# Patient Record
Sex: Male | Born: 1985 | Race: Black or African American | Hispanic: No | Marital: Married | Smoking: Never smoker
Health system: Southern US, Community
[De-identification: ages and names within clinical notes are randomized; demographics above are authoritative.]

---

## 2007-09-03 ENCOUNTER — Emergency Department: Payer: Self-pay | Admitting: Emergency Medicine

## 2007-09-07 ENCOUNTER — Ambulatory Visit: Payer: Self-pay | Admitting: Specialist

## 2007-09-07 ENCOUNTER — Other Ambulatory Visit: Payer: Self-pay

## 2007-09-12 ENCOUNTER — Ambulatory Visit: Payer: Self-pay | Admitting: Specialist

## 2008-12-17 IMAGING — CR DG FOREARM 2V*L*
1 series · 2 of 2 positions shown · non-contrast
Comparison: none

REASON FOR EXAM: foreign body
COMMENTS:

[Series 1: view not recorded · 0.17mm/px · 2 of 2 slices shown]
[im 1/2]
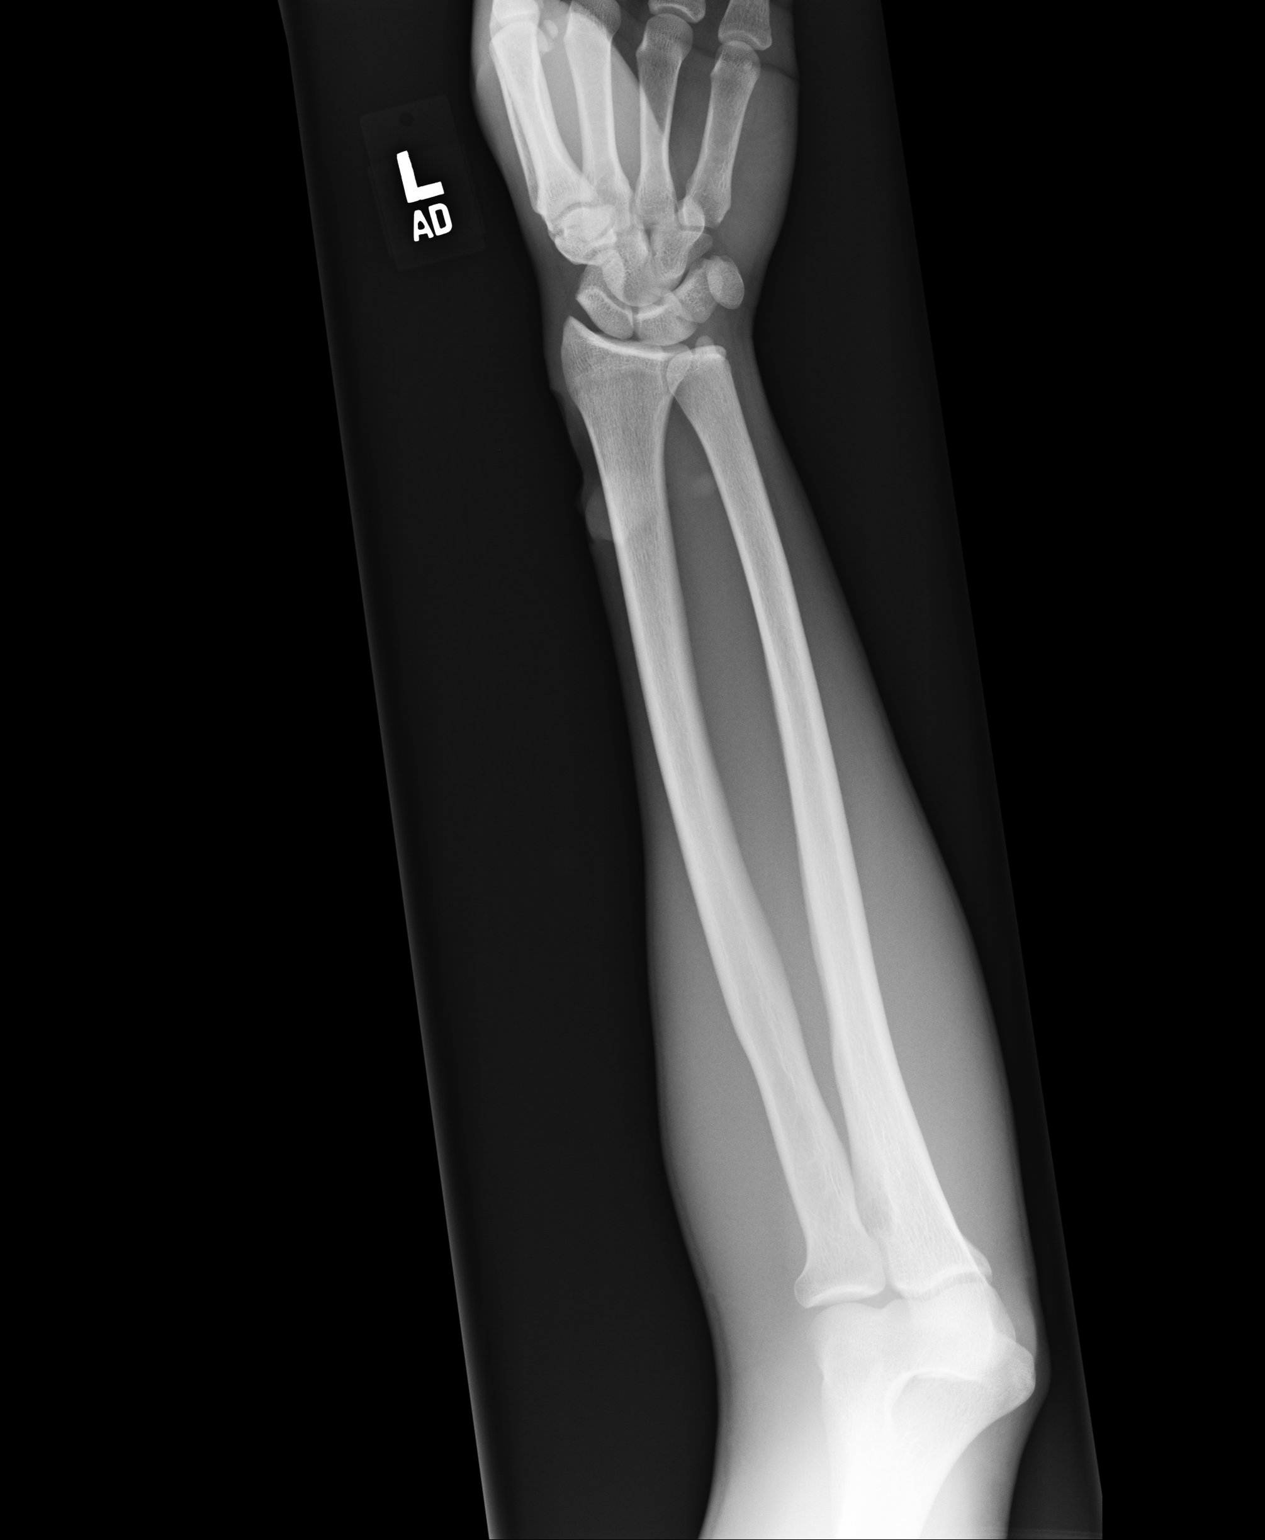
[im 2/2]
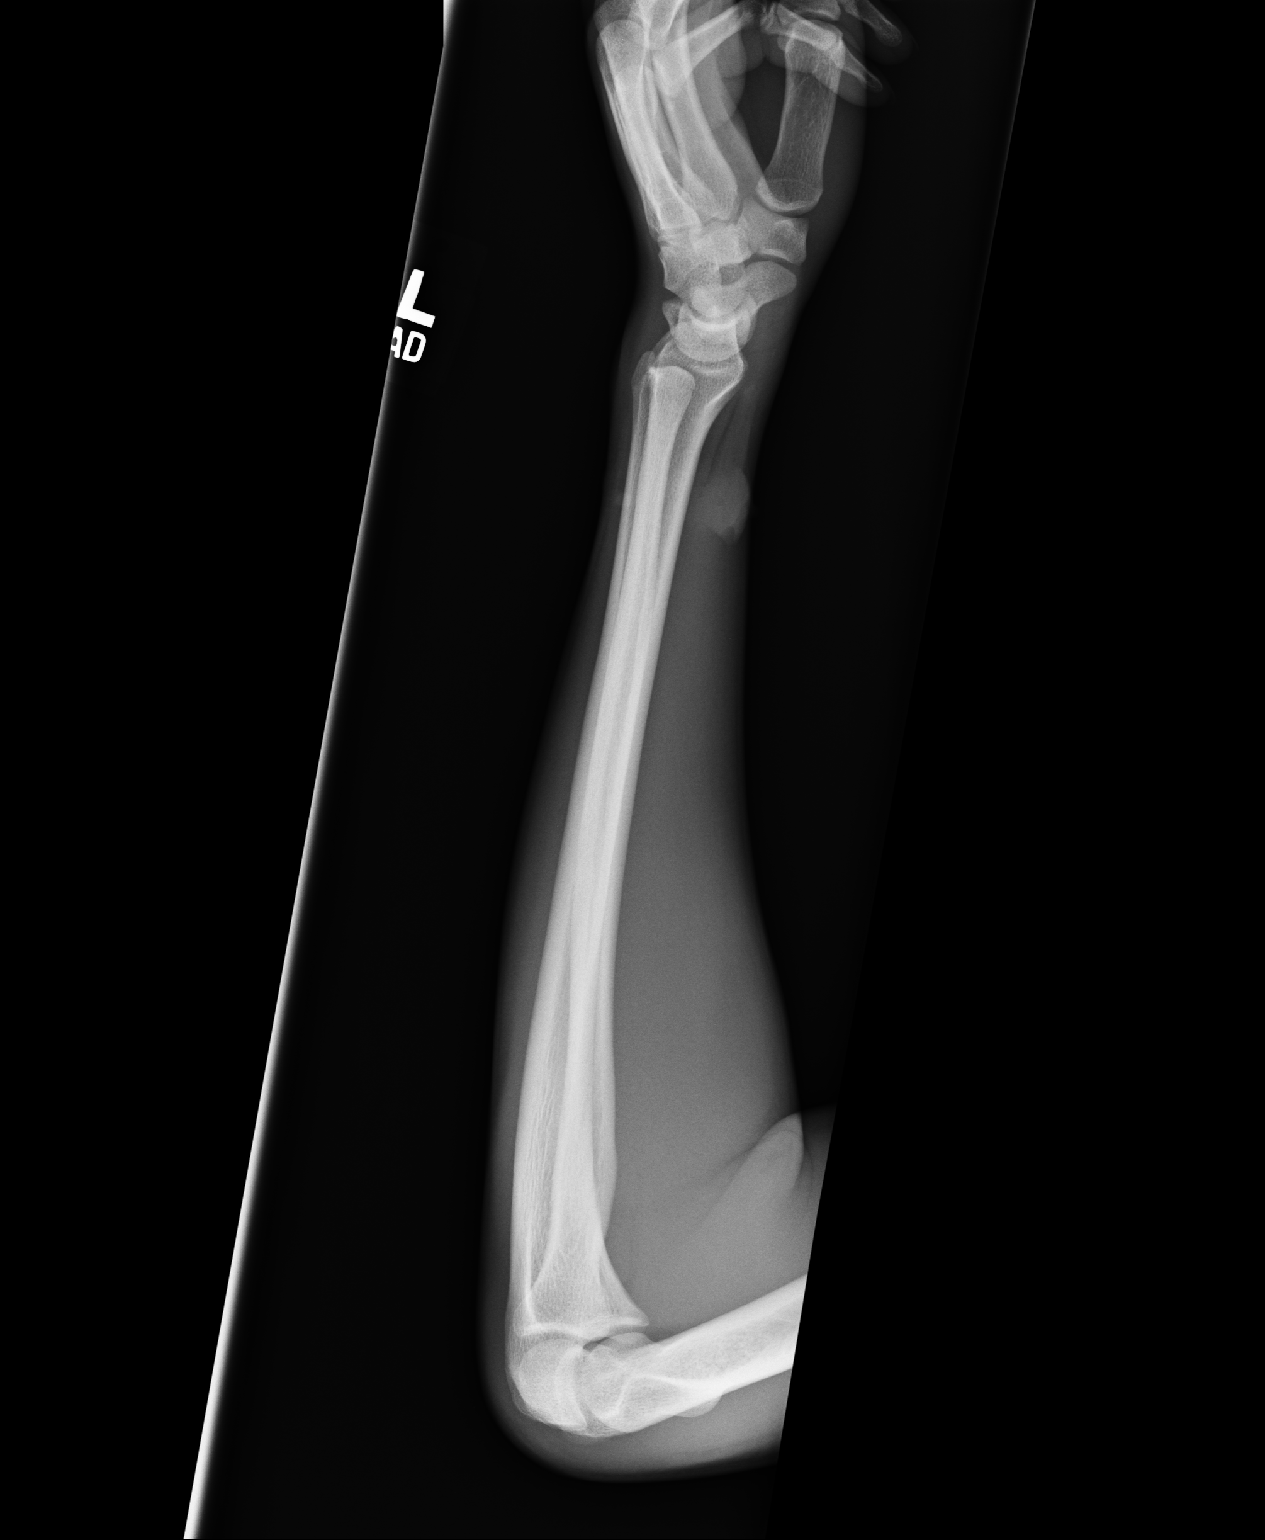

[2 of 2 positions shown; findings below may reference images not displayed]

PROCEDURE:     DXR - DXR FOREARM LEFT  - September 03, 2007  [DATE]

RESULT:     The radius and ulna are intact. There is radiodense material
projecting over the ventral aspect of the metaphyseal portion of the
forearm. This may reflect hematoma. No metallic foreign body is identified.
There is similar density over the dorsum of the arm.
IMPRESSION: I do not see evidence of a metallic foreign body. Correlation with the
underlying soft tissue injury will be needed. No fracture is identified.

## 2013-09-08 ENCOUNTER — Emergency Department: Payer: Self-pay | Admitting: Emergency Medicine

## 2014-04-14 ENCOUNTER — Emergency Department: Payer: Self-pay | Admitting: Emergency Medicine

## 2015-07-29 IMAGING — CR DG CLAVICLE*R*
1 series · 2 of 2 positions shown · non-contrast
Comparison: None.

CLINICAL DATA: Right clavicle pain after getting question off of
the porch tonight.

EXAM:
RIGHT CLAVICLE - 2+ VIEWS

[Series 1: dxr clavicle right · 0.14mm/px · 2 of 2 slices shown]
[im 1/2]
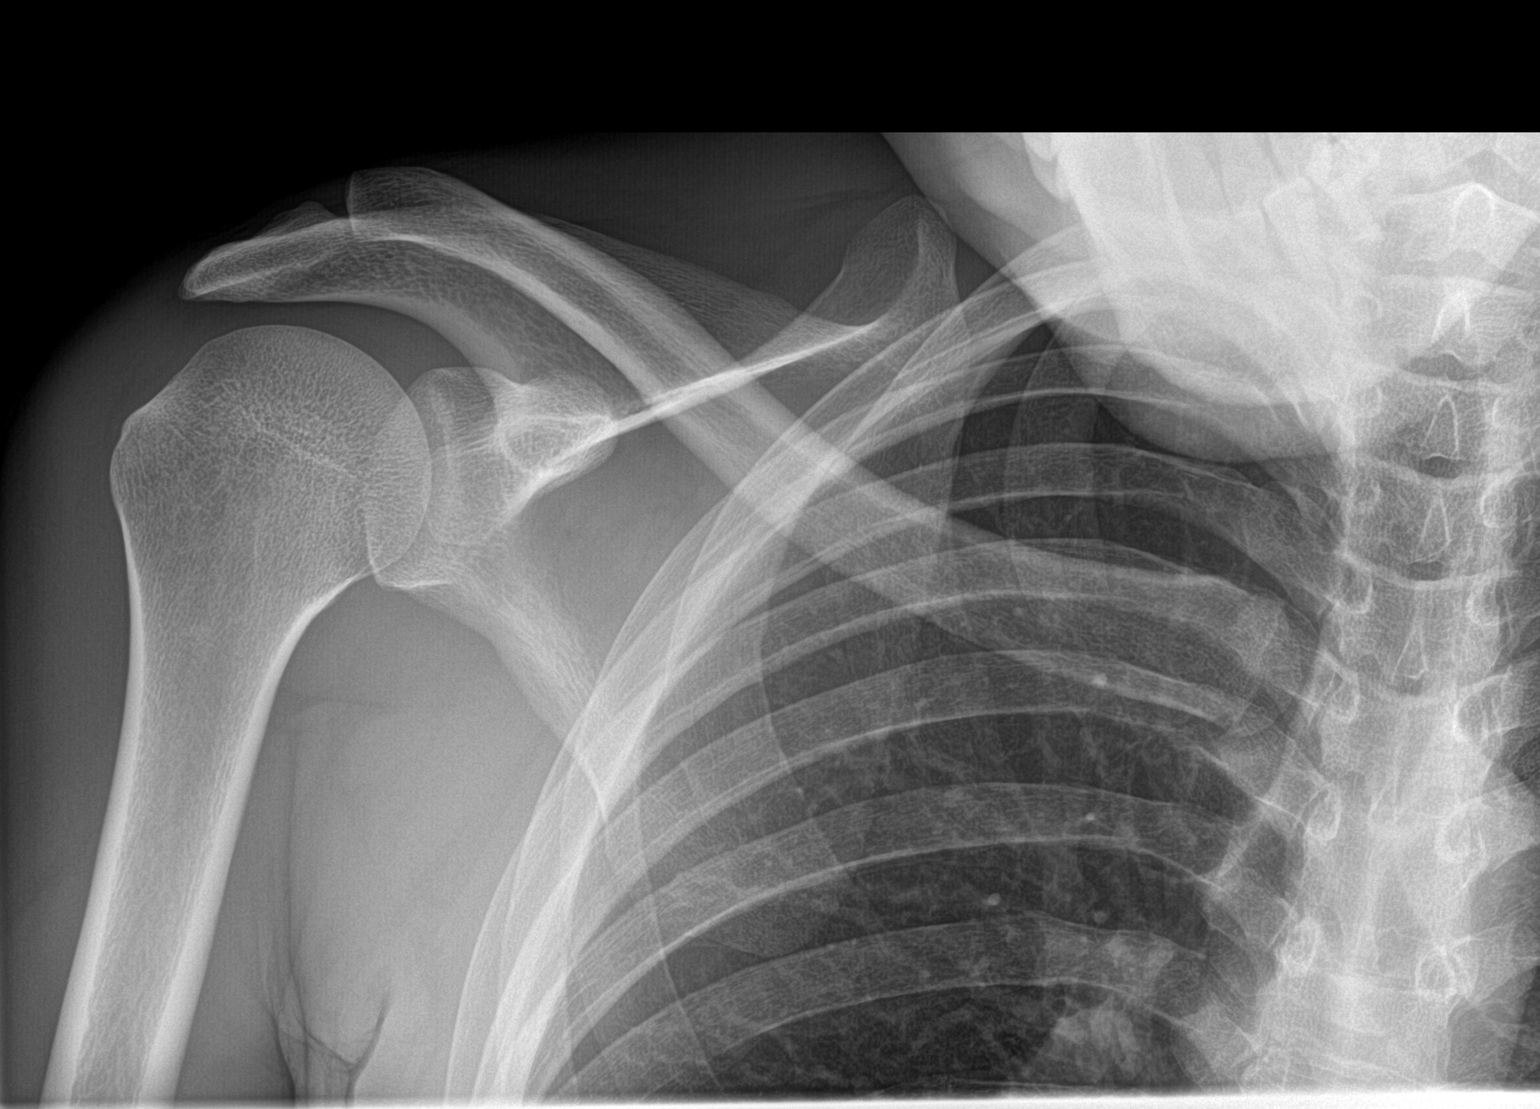
[im 2/2]
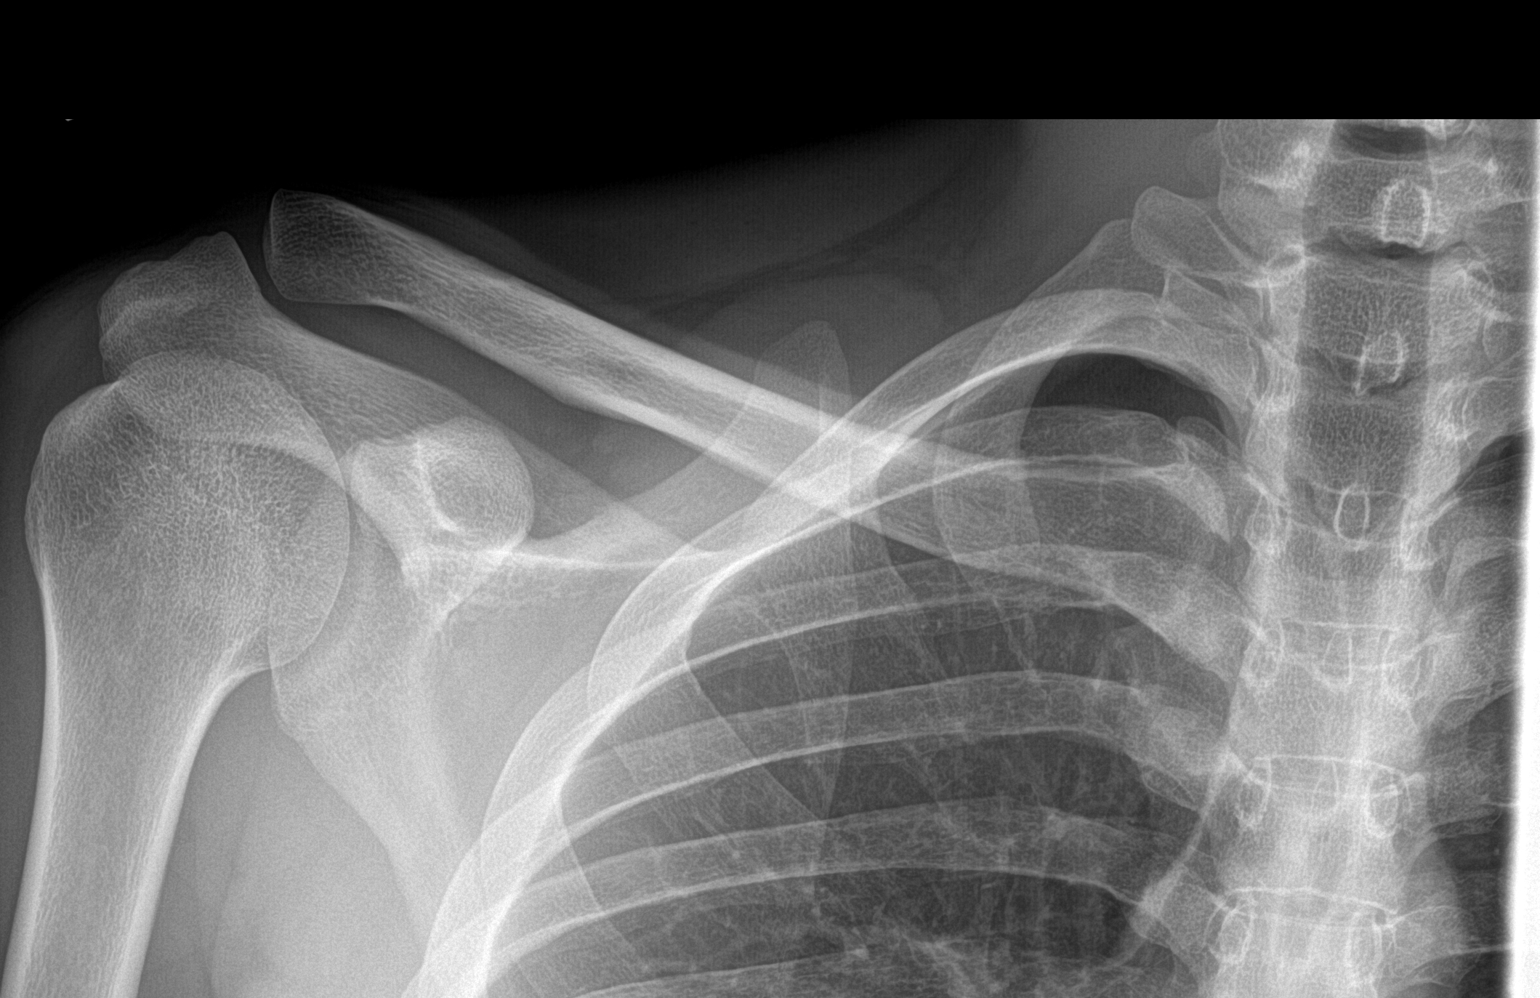

[2 of 2 positions shown; findings below may reference images not displayed]

FINDINGS: There is no evidence of fracture or other focal bone lesions. Soft
tissues are unremarkable.
IMPRESSION: Negative.

## 2020-05-22 ENCOUNTER — Emergency Department
Admission: EM | Admit: 2020-05-22 | Discharge: 2020-05-24 | Disposition: A | Discharge status: Home / Self Care | Payer: Self-pay | Attending: Emergency Medicine | Admitting: Emergency Medicine

## 2020-05-22 ENCOUNTER — Other Ambulatory Visit: Payer: Self-pay

## 2020-05-22 DIAGNOSIS — Z20822 Contact with and (suspected) exposure to covid-19: Secondary | ICD-10-CM | POA: Insufficient documentation

## 2020-05-22 DIAGNOSIS — R451 Restlessness and agitation: Secondary | ICD-10-CM | POA: Insufficient documentation

## 2020-05-22 DIAGNOSIS — F1290 Cannabis use, unspecified, uncomplicated: Secondary | ICD-10-CM | POA: Insufficient documentation

## 2020-05-22 DIAGNOSIS — F1424 Cocaine dependence with cocaine-induced mood disorder: Secondary | ICD-10-CM | POA: Insufficient documentation

## 2020-05-22 DIAGNOSIS — F302 Manic episode, severe with psychotic symptoms: Secondary | ICD-10-CM

## 2020-05-22 DIAGNOSIS — F1210 Cannabis abuse, uncomplicated: Secondary | ICD-10-CM

## 2020-05-22 DIAGNOSIS — F1494 Cocaine use, unspecified with cocaine-induced mood disorder: Secondary | ICD-10-CM

## 2020-05-22 DIAGNOSIS — F1410 Cocaine abuse, uncomplicated: Secondary | ICD-10-CM

## 2020-05-22 DIAGNOSIS — R45851 Suicidal ideations: Secondary | ICD-10-CM | POA: Insufficient documentation

## 2020-05-22 LAB — CBC
HCT: 44.1 % (ref 39.0–52.0)
Hemoglobin: 14.8 g/dL (ref 13.0–17.0)
MCH: 31.5 pg (ref 26.0–34.0)
MCHC: 33.6 g/dL (ref 30.0–36.0)
MCV: 93.8 fL (ref 80.0–100.0)
Platelets: 307 10*3/uL (ref 150–400)
RBC: 4.7 MIL/uL (ref 4.22–5.81)
RDW: 12.7 % (ref 11.5–15.5)
WBC: 7.5 10*3/uL (ref 4.0–10.5)
nRBC: 0 % (ref 0.0–0.2)

## 2020-05-22 LAB — COMPREHENSIVE METABOLIC PANEL
ALT: 17 U/L (ref 0–44)
AST: 27 U/L (ref 15–41)
Albumin: 4.6 g/dL (ref 3.5–5.0)
Alkaline Phosphatase: 61 U/L (ref 38–126)
Anion gap: 13 (ref 5–15)
BUN: 10 mg/dL (ref 6–20)
CO2: 27 mmol/L (ref 22–32)
Calcium: 9.3 mg/dL (ref 8.9–10.3)
Chloride: 96 mmol/L — ABNORMAL LOW (ref 98–111)
Creatinine, Ser: 0.96 mg/dL (ref 0.61–1.24)
GFR, Estimated: >60 mL/min (ref >60)
Glucose, Bld: 89 mg/dL (ref 70–99)
Potassium: 4 mmol/L (ref 3.5–5.1)
Sodium: 136 mmol/L (ref 135–145)
Total Bilirubin: 0.7 mg/dL (ref 0.3–1.2)
Total Protein: 7.6 g/dL (ref 6.5–8.1)

## 2020-05-22 LAB — URINALYSIS, COMPLETE (UACMP) WITH MICROSCOPIC
Bacteria, UA: NONE SEEN
Bilirubin Urine: NEGATIVE
Glucose, UA: NEGATIVE mg/dL
Hgb urine dipstick: NEGATIVE
Ketones, ur: NEGATIVE mg/dL
Leukocytes,Ua: NEGATIVE
Nitrite: NEGATIVE
Protein, ur: NEGATIVE mg/dL
Specific Gravity, Urine: 1.008 (ref 1.005–1.030)
pH: 7 (ref 5.0–8.0)

## 2020-05-22 LAB — ETHANOL: Alcohol, Ethyl (B): <10 mg/dL (ref <10)

## 2020-05-22 NOTE — ED Notes (Signed)
Pt removed jeans Belt  Blue tshirt Socks  black sneakers Silver necklace Silver cross with chain.  Wallet  Phone

## 2020-05-22 NOTE — ED Provider Notes (Signed)
[facility] Emergency Department Provider Note  ____________________________________________   First MD Initiated Contact with Patient 05/22/20 2151     (approximate)  I have reviewed the triage vital signs and the nursing notes.   HISTORY  Chief Complaint Psychiatric Evaluation    HPI FOXX KLARICH is a 34 y.o. male here with reported erratic behavior and reported suicidal ideation.  The patient arrives under IVC.  Per the IVC paperwork, he has been increasingly erratic with bizarre behavior.  He has been smoking marijuana.  He has been "talking out of his head" and making hyperreligious statements, like he is a prophet.  He reportedly told his baby's mother that he would hurt himself if they could not be together.  On my assessment, the patient is somewhat erratic, but states he does not know why is here.  He denies any of the reported statements on the IVC paperwork.  Remainder of history limited due to patient's psychiatric condition.        No past medical history on file.  There are no problems to display for this patient.    Prior to Admission medications   Not on File    Allergies Patient has no known allergies.  No family history on file.  Social History Social History   Tobacco Use  . Smoking status: Not on file  Substance Use Topics  . Alcohol use: Not on file  . Drug use: Not on file    Review of Systems  Review of Systems  Unable to perform ROS: Mental status change     ____________________________________________  PHYSICAL EXAM:      VITAL SIGNS: ED Triage Vitals [05/22/20 2121]  Enc Vitals Group     BP 131/74     Pulse Rate 74     Resp 20     Temp 98.9 F (37.2 C)     Temp Source Oral     SpO2 99 %     Weight 150 lb (68 kg)     Height 5\' 9"  (1.753 m)     Head Circumference      Peak Flow      Pain Score 0     Pain Loc      Pain Edu?      Excl. in GC?      Physical Exam Vitals and nursing note  reviewed.  Constitutional:      General: He is not in acute distress.    Appearance: He is well-developed.  HENT:     Head: Normocephalic and atraumatic.  Eyes:     Conjunctiva/sclera: Conjunctivae normal.  Cardiovascular:     Rate and Rhythm: Normal rate and regular rhythm.     Heart sounds: Normal heart sounds.  Pulmonary:     Effort: Pulmonary effort is normal. No respiratory distress.     Breath sounds: No wheezing.  Abdominal:     General: There is no distension.  Musculoskeletal:     Cervical back: Neck supple.  Skin:    General: Skin is warm.     Capillary Refill: Capillary refill takes less than 2 seconds.     Findings: No rash.  Neurological:     Mental Status: He is alert and oriented to person, place, and time.     Motor: No abnormal muscle tone.  Psychiatric:     Comments: Impulsive, somewhat agitated, labile.       ____________________________________________   LABS (all labs ordered are listed, but only abnormal  results are displayed)  Labs Reviewed  COMPREHENSIVE METABOLIC PANEL - Abnormal; Notable for the following components:      Result Value   Chloride 96 (*)    All other components within normal limits  RESPIRATORY PANEL BY RT PCR (FLU A&B, COVID)  CBC  ETHANOL  URINALYSIS, COMPLETE (UACMP) WITH MICROSCOPIC  URINE DRUG SCREEN, QUALITATIVE ([facility] ONLY)    ____________________________________________  EKG:  ________________________________________  RADIOLOGY All imaging, including plain films, CT scans, and ultrasounds, independently reviewed by me, and interpretations confirmed via formal radiology reads.  ED MD interpretation:     Official radiology report(s): No results found.  ____________________________________________  PROCEDURES   Procedure(s) performed (including Critical Care):  Procedures  ____________________________________________  INITIAL IMPRESSION / MDM / ASSESSMENT AND PLAN / ED COURSE  As part of my medical  decision making, I reviewed the following data within the electronic MEDICAL RECORD NUMBER Nursing notes reviewed and incorporated, Old chart reviewed, Notes from prior ED visits, and [place] Controlled Substance Database       *SHLOIME KEILMAN was evaluated in Emergency Department on 05/22/2020 for the symptoms described in the history of present illness. He was evaluated in the context of the global COVID-19 pandemic, which necessitated consideration that the patient might be at risk for infection with the SARS-CoV-2 virus that causes COVID-19. Institutional protocols and algorithms that pertain to the evaluation of patients at risk for COVID-19 are in a state of rapid change based on information released by regulatory bodies including the CDC and federal and state organizations. These policies and algorithms were followed during the patient's care in the ED.  Some ED evaluations and interventions may be delayed as a result of limited staffing during the pandemic.*     Medical Decision Making:  34 yo M here with erratic behavior, THC use, and reported SI per IVC paperwork. Pt denies the above on my interview, but IVC paperwork certainly sounds concerning. He does appear somewhat labile, irritated. Will have TTS/Psych eval. Labs reassuring - no significant lyte abnormality, tox labs unremarkable. UDS is pending.   The patient has been placed in psychiatric observation due to the need to provide a safe environment for the patient while obtaining psychiatric consultation and evaluation, as well as ongoing medical and medication management to treat the patient's condition.  The patient has been placed under full IVC at this time.   ____________________________________________  FINAL CLINICAL IMPRESSION(S) / ED DIAGNOSES  Final diagnoses:  Suicidal ideation     MEDICATIONS GIVEN DURING THIS VISIT:  Medications - No data to display   ED Discharge Orders    None       Note:  This document was  prepared using Dragon voice recognition software and may include unintentional dictation errors.   Shaune Pollack, MD 05/22/20 2318

## 2020-05-22 NOTE — ED Notes (Signed)
Pt states "I'm not sure why I'm here, they just picked me up and now I'm here." Pt smiling and cooperative at this time. States "My baby momma I guess called them but I've been trying to find her for 2 days she went off the map. I had use someone else's phone to call her". When asked pt states they have children and live together. Pt repeats "I don't know why I'm here." This RN explained IVC process, pt states understanding and remains calm at this time. Drink and meal tray given. Denies pain or further needs at this time.

## 2020-05-22 NOTE — ED Triage Notes (Signed)
Pt brought in by [place] [employer] under IVC, pt states "I have no clue why I am here". Pt denies any SI or HI at this time.

## 2020-05-23 ENCOUNTER — Inpatient Hospital Stay: Admission: RE | Admit: 2020-05-23 | Payer: Self-pay | Source: Intra-hospital | Admitting: Psychiatry

## 2020-05-23 DIAGNOSIS — F302 Manic episode, severe with psychotic symptoms: Secondary | ICD-10-CM

## 2020-05-23 DIAGNOSIS — F1210 Cannabis abuse, uncomplicated: Secondary | ICD-10-CM

## 2020-05-23 DIAGNOSIS — F1410 Cocaine abuse, uncomplicated: Secondary | ICD-10-CM

## 2020-05-23 LAB — URINE DRUG SCREEN, QUALITATIVE (ARMC ONLY)
Amphetamines, Ur Screen: NOT DETECTED
Barbiturates, Ur Screen: NOT DETECTED
Benzodiazepine, Ur Scrn: NOT DETECTED
Cannabinoid 50 Ng, Ur ~~LOC~~: POSITIVE — AB
Cocaine Metabolite,Ur ~~LOC~~: POSITIVE — AB
MDMA (Ecstasy)Ur Screen: NOT DETECTED
Methadone Scn, Ur: NOT DETECTED
Opiate, Ur Screen: NOT DETECTED
Phencyclidine (PCP) Ur S: NOT DETECTED
Tricyclic, Ur Screen: NOT DETECTED

## 2020-05-23 LAB — RESPIRATORY PANEL BY RT PCR (FLU A&B, COVID)
Influenza A by PCR: NEGATIVE
Influenza B by PCR: NEGATIVE
SARS Coronavirus 2 by RT PCR: NEGATIVE

## 2020-05-23 MED ORDER — ZIPRASIDONE MESYLATE 20 MG IM SOLR
20.0000 mg | Freq: Once | INTRAMUSCULAR | Status: AC
  Administered 2020-05-23: 20 mg via INTRAMUSCULAR
  Filled 2020-05-23: qty 20

## 2020-05-23 MED ORDER — LORAZEPAM 2 MG PO TABS: 2.0000 mg | ORAL_TABLET | ORAL | Status: DC | PRN

## 2020-05-23 MED ORDER — OLANZAPINE 10 MG PO TBDP
10.0000 mg | ORAL_TABLET | Freq: Every day | ORAL | Status: DC
  Administered 2020-05-23: 10 mg via ORAL
  Filled 2020-05-23 (×3): qty 1

## 2020-05-23 MED ORDER — LORAZEPAM 2 MG/ML IJ SOLN
2.0000 mg | INTRAMUSCULAR | Status: DC | PRN
  Administered 2020-05-23: 2 mg via INTRAMUSCULAR
  Filled 2020-05-23: qty 1

## 2020-05-23 NOTE — BH Assessment (Signed)
Patient can come down after 8pm   Call to give report: [phone]  Patient is to be admitted to [facility] by Dr. Neale Burly.  Attending Physician will be. Dr. Neale Burly.   Patient has been assigned to room 325, by [facility] Charge Nurse Demetria, RN.   Intake Paper Work has been signed and placed on patient chart.  ER staff is aware of the admission: 1. Carlisle Beers, ER Secretary  2. Katrinka Blazing, ER MD  3. Amy, Patient's Nurse  4. Ethelene Browns, Patient Access.

## 2020-05-23 NOTE — ED Notes (Signed)
PT REMAINS IVC W/ PSYCH CONSULT COMPLETED. PENDING PLACEMENT

## 2020-05-23 NOTE — ED Notes (Signed)
Patient transferred to [facility] from ED to room 3 after screening for contraband. Report received from Mac, RN including Situation, Background, Assessment and Recommendations. Pt oriented to unit including Q15 minute rounds as well as the security cameras for their protection. Patient is alert and oriented, warm and dry in no acute distress. Patient denies SI, HI, and AVH. Pt. Encouraged to let this nurse know if needs arise. Pt is unhappy with arrival to [facility] due to not wanting to be "locked up."  Pt states he will "play your game now. But I am only in here 24 hours. Gaspar Cola tried to do this to me too." Pt to restroom at this time.

## 2020-05-23 NOTE — ED Notes (Addendum)
Plan of care discussed including his pending inpatient hospitalization - spoke with him about his IVC and what it says  He states  "Fuck this - I don't want to go into the hospital - just send my ass to jail so that I can get out and go to work tomorrow - You are going to make me lose my job - I got three kids - they need me  - I am not even married to her - how can she convince y'all to hold me here"  I explained to him that she does not make the decision here - I told him I do not make the decisions here - explained that the MD's here have decided for his safety and for his manic paranoid behaviors  - he will need hospitalization  He is angry and pacing within the dayroom

## 2020-05-23 NOTE — BH Assessment (Signed)
Leighton Parody from [facility] called requesting additionally documentation. Task completed at 3:40 PM.
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
## 2020-05-23 NOTE — ED Notes (Signed)
Hourly rounding completed at this time, patient currently asleep in room. No complaints, stable, and in no acute distress. Q15 minute rounds and monitoring via Security Cameras to continue. 
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
## 2020-05-23 NOTE — ED Provider Notes (Signed)
Emergency Medicine Observation Re-evaluation Note  Lucas Kramer is a 34 y.o. male, seen on rounds today.  Pt initially presented to the ED for complaints of Psychiatric Evaluation Currently, the patient is resting.  Physical Exam  BP 131/74 (BP Location: Left Arm)   Pulse 74   Temp 98.9 F (37.2 C) (Oral)   Resp 20   Ht 5\' 9"  (1.753 m)   Wt 68 kg   SpO2 99%   BMI 22.15 kg/m  Physical Exam Constitutional:      Appearance: He is not ill-appearing or toxic-appearing.  Eyes:     Extraocular Movements: Extraocular movements intact.     Pupils: Pupils are equal, round, and reactive to light.  Cardiovascular:     Rate and Rhythm: Normal rate.  Pulmonary:     Effort: Pulmonary effort is normal.  Abdominal:     General: There is no distension.  Skin:    General: Skin is warm and dry.  Neurological:     General: No focal deficit present.     Cranial Nerves: No cranial nerve deficit.      ED Course / MDM  EKG:    I have reviewed the labs performed to date as well as medications administered while in observation.  Recent changes in the last 24 hours include continued bed search.  Plan  Current plan is for  Inpatient psychiatric care. Patient is under full IVC at this time.   , MD 05/23/20 [phone]
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
## 2020-05-23 NOTE — ED Notes (Signed)
Report attempted, per Collene Mares, RN charge, staff called out and they must reassess prior to taking pt, phone number given, charge will be notified

## 2020-05-23 NOTE — ED Notes (Signed)
IVC patient to BMU 325 after 8p
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
## 2020-05-23 NOTE — Consult Note (Signed)
  Please psychiatry consult note: Patient seen and chart reviewed.  Case reviewed with emergency room physician.  Patient appears to be best served by inpatient hospitalization.  Will uphold involuntary commitment status.  Orders and full note to follow

## 2020-05-24 DIAGNOSIS — F1494 Cocaine use, unspecified with cocaine-induced mood disorder: Secondary | ICD-10-CM | POA: Diagnosis present

## 2020-05-24 NOTE — ED Notes (Signed)
Pt to toilet and returned to bed 

## 2020-05-24 NOTE — ED Provider Notes (Signed)
Emergency Medicine Observation Re-evaluation Note  Lucas Kramer is a 34 y.o. male, seen on rounds today.  Pt initially presented to the ED for complaints of Psychiatric Evaluation Currently, the patient is still in bed, voices no complaints  Physical Exam  BP (!) 91/49 (BP Location: Left Arm)   Pulse 90   Temp 98.8 F (37.1 C) (Oral)   Resp 20   Ht 1.753 m (5\' 9" )   Wt 68 kg   SpO2 100%   BMI 22.15 kg/m  Physical Exam General: In bed no acute distress  Lungs: No increased work of breathing Psych: No significant changes from yesterday, still with some delusions and psychosis ED Course / MDM  Patient has been seen by psychiatrist Dr. who recommends inpatient admission  Plan  Current plan is for inpatient admission. Patient is under full IVC at this time.   Toni Amend, MD 05/24/20 [phone]

## 2020-05-24 NOTE — ED Provider Notes (Signed)
Patient has been seen by behavioral team today, at this time they are recommending discharge   Jene Every, MD 05/24/20 1237
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
## 2020-05-24 NOTE — Discharge Instructions (Signed)
[facility]  Non-profit organization in [place], [place] Address: [address] [place], [place], [place] [address] Hours:  Closed ? Opens 9AM Mon Phone: [phone]
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
## 2020-05-24 NOTE — ED Notes (Signed)
Pt to RN station, given ice water, pt cleaned room of food and drink trash

## 2021-03-14 ENCOUNTER — Other Ambulatory Visit: Payer: Self-pay

## 2021-03-14 ENCOUNTER — Encounter: Payer: Self-pay | Admitting: Emergency Medicine

## 2021-03-14 ENCOUNTER — Emergency Department
Admission: EM | Admit: 2021-03-14 | Discharge: 2021-03-14 | Disposition: A | Discharge status: Home / Self Care | Payer: Self-pay | Attending: Emergency Medicine | Admitting: Emergency Medicine

## 2021-03-14 DIAGNOSIS — K029 Dental caries, unspecified: Secondary | ICD-10-CM | POA: Insufficient documentation

## 2021-03-14 MED ORDER — ONDANSETRON 4 MG PO TBDP: 4.0000 mg | ORAL_TABLET | Freq: Four times a day (QID) | ORAL | 0 refills | Status: DC | PRN

## 2021-03-14 MED ORDER — IBUPROFEN 800 MG PO TABS: 800.0000 mg | ORAL_TABLET | Freq: Three times a day (TID) | ORAL | 0 refills | Status: AC | PRN

## 2021-03-14 MED ORDER — HYDROCODONE-ACETAMINOPHEN 5-325 MG PO TABS
2.0000 | ORAL_TABLET | Freq: Once | ORAL | Status: AC
Start: 2021-03-14 — End: 2021-03-14
  Administered 2021-03-14: 2 via ORAL
  Filled 2021-03-14: qty 2

## 2021-03-14 MED ORDER — ONDANSETRON 4 MG PO TBDP
4.0000 mg | ORAL_TABLET | Freq: Once | ORAL | Status: AC
  Administered 2021-03-14: 4 mg via ORAL
  Filled 2021-03-14: qty 1

## 2021-03-14 MED ORDER — PENICILLIN V POTASSIUM 250 MG PO TABS
500.0000 mg | ORAL_TABLET | Freq: Once | ORAL | Status: AC
  Administered 2021-03-14: 500 mg via ORAL
  Filled 2021-03-14: qty 2

## 2021-03-14 MED ORDER — IBUPROFEN 800 MG PO TABS
800.0000 mg | ORAL_TABLET | Freq: Once | ORAL | Status: AC
  Administered 2021-03-14: 800 mg via ORAL
  Filled 2021-03-14: qty 1

## 2021-03-14 MED ORDER — PENICILLIN V POTASSIUM 500 MG PO TABS: 500.0000 mg | ORAL_TABLET | Freq: Four times a day (QID) | ORAL | 0 refills | Status: AC

## 2021-03-14 MED ORDER — HYDROCODONE-ACETAMINOPHEN 5-325 MG PO TABS: 2.0000 | ORAL_TABLET | Freq: Four times a day (QID) | ORAL | 0 refills | Status: AC | PRN

## 2021-03-14 NOTE — ED Notes (Signed)
Pt A&O4 ambulatory at d/c with independent steady gait, NAD. Pt verbalized understanding of d/c instructions, prescriptions and follow up care.

## 2021-03-14 NOTE — ED Triage Notes (Signed)
Pt c/o L side dental pain with swelling since yesterday

## 2021-03-14 NOTE — Discharge Instructions (Signed)

## 2021-03-14 NOTE — ED Provider Notes (Signed)
[facility] Emergency Department Provider Note  ____________________________________________   Event Date/Time   First MD Initiated Contact with Patient 03/14/21 [phone]     (approximate)  I have reviewed the triage vital signs and the nursing notes.   HISTORY  Chief Complaint Dental Pain    HPI Lucas Kramer is a 35 y.o. male with history of cocaine abuse who presents to the emergency department with complaints of left upper dental pain for the past day.  States he has a Education officer, community and he has scheduled an appointment but is not until next week.  No relief with over-the-counter pain medication.  No fevers, facial swelling, difficulty swallowing, speaking or breathing.      left upper  History reviewed. No pertinent past medical history.  Patient Active Problem List   Diagnosis Date Noted   Cocaine-induced mood disorder (HCC) 05/24/2020   Cocaine abuse (HCC) 05/23/2020   Cannabis abuse 05/23/2020    History reviewed. No pertinent surgical history.  Prior to Admission medications   Medication Sig Start Date End Date Taking? Authorizing Provider  HYDROcodone-acetaminophen (NORCO/VICODIN) 5-325 MG tablet Take 2 tablets by mouth every 6 (six) hours as needed. 03/14/21  Yes Marv Alfrey, Layla Maw, DO  ibuprofen (ADVIL) 800 MG tablet Take 1 tablet (800 mg total) by mouth every 8 (eight) hours as needed for mild pain. 03/14/21  Yes Dhruva Orndoff, Baxter Hire N, DO  ondansetron (ZOFRAN ODT) 4 MG disintegrating tablet Take 1 tablet (4 mg total) by mouth every 6 (six) hours as needed for nausea or vomiting. 03/14/21  Yes Esperansa Sarabia, Baxter Hire N, DO  penicillin v potassium (VEETID) 500 MG tablet Take 1 tablet (500 mg total) by mouth 4 (four) times daily. 03/14/21  Yes Polo Mcmartin, Layla Maw, DO    Allergies Patient has no known allergies.  History reviewed. No pertinent family history.  Social History    Review of Systems Constitutional: No fever. Eyes: No visual changes. ENT: No sore  throat. Cardiovascular: Denies chest pain. Respiratory: Denies shortness of breath. Gastrointestinal: No nausea, vomiting, diarrhea. Genitourinary: Negative for dysuria. Musculoskeletal: Negative for back pain. Skin: Negative for rash. Neurological: Negative for focal weakness or numbness.  ____________________________________________   PHYSICAL EXAM:  VITAL SIGNS: ED Triage Vitals [03/14/21 0601]  Enc Vitals Group     BP 131/78     Pulse Rate (!) 112     Resp 14     Temp 98.2 F (36.8 C)     Temp Source Oral     SpO2 98 %     Weight 149 lb 14.6 oz (68 kg)     Height 5\' 9"  (1.753 m)     Head Circumference      Peak Flow      Pain Score 10     Pain Loc      Pain Edu?      Excl. in GC?    CONSTITUTIONAL: Alert and oriented and responds appropriately to questions.  Afebrile, well-nourished, appears uncomfortable but nontoxic HEAD: Normocephalic EYES: Conjunctivae clear, pupils appear equal, EOM appear intact ENT: normal nose; moist mucous membranes, patient has decay noted to the left upper third molar without sign of drainable abscess.  There is mild gingival swelling.  Tongue sits flat in the bottom of the mouth.  No sign of Ludwig's angina.  Normal phonation.  No trismus, drooling.  Tolerating secretions.  No tonsillar hypertrophy or exudate.  No uvular deviation or swelling. NECK: Supple, normal ROM CARD: Regular and tachycardic; S1 and  S2 appreciated; no murmurs, no clicks, no rubs, no gallops RESP: Normal chest excursion without splinting or tachypnea; breath sounds clear and equal bilaterally; no wheezes, no rhonchi, no rales, no hypoxia or respiratory distress, speaking full sentences ABD/GI: Normal bowel sounds; non-distended; soft, non-tender, no rebound, no guarding, no peritoneal signs, no hepatosplenomegaly BACK: The back appears normal EXT: Normal ROM in all joints; no deformity noted, no edema; no cyanosis SKIN: Normal color for age and race; warm; no rash on  exposed skin NEURO: Moves all extremities equally PSYCH: The patient's mood and manner are appropriate.  ____________________________________________   LABS (all labs ordered are listed, but only abnormal results are displayed)  Labs Reviewed - No data to display ____________________________________________  EKG   ____________________________________________  RADIOLOGY I, Johnwesley Lederman, personally viewed and evaluated these images (plain radiographs) as part of my medical decision making, as well as reviewing the written report by the radiologist.  ED MD interpretation:    Official radiology report(s): No results found.  ____________________________________________   PROCEDURES  Procedure(s) performed (including Critical Care):  Procedures    ____________________________________________   INITIAL IMPRESSION / ASSESSMENT AND PLAN / ED COURSE  As part of my medical decision making, I reviewed the following data within the electronic MEDICAL RECORD NUMBER Nursing notes reviewed and incorporated, Old chart reviewed, Notes from prior ED visits, and [place] Controlled Substance Database         Patient here with dental caries causing dental pain.  Will discharge on prophylactic antibiotics and provide with pain medication.  He states he has dental follow-up next week.  No sign of any emergent condition present today including Ludwig's angina.  Will discharge home.  Discussed return precautions.  Wife drove him here to the emergency department.  Given pain medication in the ED.  At this time, I do not feel there is any life-threatening condition present. I have reviewed, interpreted and discussed all results (EKG, imaging, lab, urine as appropriate) and exam findings with patient/family. I have reviewed nursing notes and appropriate previous records.  I feel the patient is safe to be discharged home without further emergent workup and can continue workup as an outpatient as needed.  Discussed usual and customary return precautions. Patient/family verbalize understanding and are comfortable with this plan.  Outpatient follow-up has been provided as needed. All questions have been answered.  ____________________________________________   FINAL CLINICAL IMPRESSION(S) / ED DIAGNOSES  Final diagnoses:  Pain due to dental caries     ED Discharge Orders          Ordered    penicillin v potassium (VEETID) 500 MG tablet  4 times daily        03/14/21 0633    ibuprofen (ADVIL) 800 MG tablet  Every 8 hours PRN        03/14/21 0633    HYDROcodone-acetaminophen (NORCO/VICODIN) 5-325 MG tablet  Every 6 hours PRN        03/14/21 0633    ondansetron (ZOFRAN ODT) 4 MG disintegrating tablet  Every 6 hours PRN        03/14/21 5638            *Please note:  Lucas Kramer was evaluated in Emergency Department on 03/14/2021 for the symptoms described in the history of present illness. He was evaluated in the context of the global COVID-19 pandemic, which necessitated consideration that the patient might be at risk for infection with the SARS-CoV-2 virus that causes COVID-19. Institutional protocols and algorithms that  pertain to the evaluation of patients at risk for COVID-19 are in a state of rapid change based on information released by regulatory bodies including the CDC and federal and state organizations. These policies and algorithms were followed during the patient's care in the ED.  Some ED evaluations and interventions may be delayed as a result of limited staffing during and the pandemic.*   Note:  This document was prepared using Dragon voice recognition software and may include unintentional dictation errors.    Chardae Mulkern, Layla Maw, DO 03/14/21 [phone]

## 2023-10-11 ENCOUNTER — Emergency Department
Admission: EM | Admit: 2023-10-11 | Discharge: 2023-10-12 | Disposition: A | Discharge status: Home / Self Care | Payer: MEDICAID | Attending: Emergency Medicine | Admitting: Emergency Medicine

## 2023-10-11 ENCOUNTER — Emergency Department: Payer: MEDICAID

## 2023-10-11 ENCOUNTER — Other Ambulatory Visit: Payer: Self-pay

## 2023-10-11 DIAGNOSIS — R55 Syncope and collapse: Secondary | ICD-10-CM | POA: Diagnosis not present

## 2023-10-11 DIAGNOSIS — R0789 Other chest pain: Secondary | ICD-10-CM | POA: Diagnosis not present

## 2023-10-11 DIAGNOSIS — R509 Fever, unspecified: Secondary | ICD-10-CM | POA: Diagnosis present

## 2023-10-11 DIAGNOSIS — J101 Influenza due to other identified influenza virus with other respiratory manifestations: Secondary | ICD-10-CM | POA: Insufficient documentation

## 2023-10-11 LAB — COMPREHENSIVE METABOLIC PANEL
ALT: 17 U/L (ref 0–44)
AST: 25 U/L (ref 15–41)
Albumin: 4.2 g/dL (ref 3.5–5.0)
Alkaline Phosphatase: 56 U/L (ref 38–126)
Anion gap: 9 (ref 5–15)
BUN: 10 mg/dL (ref 6–20)
CO2: 24 mmol/L (ref 22–32)
Calcium: 9 mg/dL (ref 8.9–10.3)
Chloride: 102 mmol/L (ref 98–111)
Creatinine, Ser: 1.03 mg/dL (ref 0.61–1.24)
GFR, Estimated: >60 mL/min (ref >60)
Glucose, Bld: 99 mg/dL (ref 70–99)
Potassium: 4 mmol/L (ref 3.5–5.1)
Sodium: 135 mmol/L (ref 135–145)
Total Bilirubin: 0.8 mg/dL (ref 0.0–1.2)
Total Protein: 7 g/dL (ref 6.5–8.1)

## 2023-10-11 LAB — RESP PANEL BY RT-PCR (RSV, FLU A&B, COVID)  RVPGX2
Influenza A by PCR: POSITIVE — AB
Influenza B by PCR: NEGATIVE
Resp Syncytial Virus by PCR: NEGATIVE
SARS Coronavirus 2 by RT PCR: NEGATIVE

## 2023-10-11 LAB — CBC
HCT: 40.9 % (ref 39.0–52.0)
Hemoglobin: 13.5 g/dL (ref 13.0–17.0)
MCH: 31.3 pg (ref 26.0–34.0)
MCHC: 33 g/dL (ref 30.0–36.0)
MCV: 94.7 fL (ref 80.0–100.0)
Platelets: 221 10*3/uL (ref 150–400)
RBC: 4.32 MIL/uL (ref 4.22–5.81)
RDW: 12.7 % (ref 11.5–15.5)
WBC: 6.2 10*3/uL (ref 4.0–10.5)
nRBC: 0 % (ref 0.0–0.2)

## 2023-10-11 LAB — GROUP A STREP BY PCR: Group A Strep by PCR: NOT DETECTED

## 2023-10-11 LAB — LIPASE, BLOOD: Lipase: 22 U/L (ref 11–51)

## 2023-10-11 MED ORDER — ACETAMINOPHEN 325 MG PO TABS
650.0000 mg | ORAL_TABLET | Freq: Once | ORAL | Status: AC
Start: 2023-10-11 — End: 2023-10-11
  Administered 2023-10-11: 650 mg via ORAL
  Filled 2023-10-11: qty 2

## 2023-10-11 MED ORDER — KETOROLAC TROMETHAMINE 30 MG/ML IJ SOLN
30.0000 mg | Freq: Once | INTRAMUSCULAR | Status: AC
  Administered 2023-10-12: 30 mg via INTRAVENOUS
  Filled 2023-10-11: qty 1

## 2023-10-11 MED ORDER — SODIUM CHLORIDE 0.9 % IV BOLUS (SEPSIS)
1000.0000 mL | Freq: Once | INTRAVENOUS | Status: AC
  Administered 2023-10-12: 1000 mL via INTRAVENOUS

## 2023-10-11 MED ORDER — LIDOCAINE VISCOUS HCL 2 % MT SOLN
15.0000 mL | Freq: Once | OROMUCOSAL | Status: AC
  Administered 2023-10-12: 15 mL via OROMUCOSAL
  Filled 2023-10-11: qty 15

## 2023-10-11 MED ORDER — ONDANSETRON HCL 4 MG/2ML IJ SOLN
4.0000 mg | Freq: Once | INTRAMUSCULAR | Status: AC
  Administered 2023-10-12: 4 mg via INTRAVENOUS
  Filled 2023-10-11: qty 2

## 2023-10-11 NOTE — ED Triage Notes (Addendum)
 Pt reports since last night he has had cough congestion fever, pt states he think he has covid. Pt also c/o sore throat and generalized abd pain

## 2023-10-11 NOTE — ED Provider Notes (Signed)
 [facility] Provider Note    Event Date/Time   First MD Initiated Contact with Patient 10/11/23 2323     (approximate)   History   Cough and Fever   HPI  Lucas Kramer is a 38 y.o. male with history of substance use disorder who presents to the emergency department with flulike symptoms for the past 2 days.  He has had fevers, cough, congestion, vomiting, body aches.  Reports he had 2 syncopal events today while walking.  Reports he did have some chest discomfort and shortness of breath.  No diarrhea.  No chest pain or shortness of breath currently.  No history of PE, DVT, exogenous estrogen use, recent fractures, surgery, trauma, hospitalization, prolonged travel or other immobilization. No lower extremity swelling or pain. No calf tenderness.  Patient is complaining of pain with swallowing.  History provided by patient.    History reviewed. No pertinent past medical history.  History reviewed. No pertinent surgical history.  MEDICATIONS:  Prior to Admission medications   Medication Sig Start Date End Date Taking? Authorizing Provider  HYDROcodone-acetaminophen (NORCO/VICODIN) 5-325 MG tablet Take 2 tablets by mouth every 6 (six) hours as needed. 03/14/21   Jolyne Laye, Layla Maw, DO  ibuprofen (ADVIL) 800 MG tablet Take 1 tablet (800 mg total) by mouth every 8 (eight) hours as needed for mild pain. 03/14/21   Dhalia Zingaro, Layla Maw, DO  ondansetron (ZOFRAN ODT) 4 MG disintegrating tablet Take 1 tablet (4 mg total) by mouth every 6 (six) hours as needed for nausea or vomiting. 03/14/21   Klayten Jolliff, Layla Maw, DO  penicillin v potassium (VEETID) 500 MG tablet Take 1 tablet (500 mg total) by mouth 4 (four) times daily. 03/14/21   Willow Reczek, Layla Maw, DO    Physical Exam   Triage Vital Signs: ED Triage Vitals  Encounter Vitals Group     BP 10/11/23 1906 100/84     Systolic BP Percentile --      Diastolic BP Percentile --      Pulse Rate 10/11/23 1906 (!) 103     Resp  10/11/23 1906 20     Temp 10/11/23 1906 (!) 100.5 F (38.1 C)     Temp Source 10/11/23 1906 Oral     SpO2 10/11/23 1906 99 %     Weight 10/11/23 1905 160 lb (72.6 kg)     Height 10/11/23 1905 5\' 9"  (1.753 m)     Head Circumference --      Peak Flow --      Pain Score 10/11/23 1905 10     Pain Loc --      Pain Education --      Exclude from Growth Chart --     Most recent vital signs: Vitals:   10/12/23 0100 10/12/23 0200  BP: (!) 103/55 (!) 103/57  Pulse: 97 89  Resp:    Temp:    SpO2: 95% 96%    CONSTITUTIONAL: Alert, responds appropriately to questions. Well-appearing; well-nourished HEAD: Normocephalic, atraumatic EYES: Conjunctivae clear, pupils appear equal, sclera nonicteric ENT: normal nose; moist mucous membranes, yellow drainage noted in the posterior oropharynx, no tonsillar hypertrophy or exudate, no uvular deviation, no trismus, no stridor or drooling, normal phonation NECK: Supple, normal ROM CARD: RRR; S1 and S2 appreciated RESP: Normal chest excursion without splinting or tachypnea; breath sounds clear and equal bilaterally; no wheezes, no rhonchi, no rales, no hypoxia or respiratory distress, speaking full sentences ABD/GI: Non-distended; soft, non-tender, no rebound, no guarding, no  peritoneal signs BACK: The back appears normal EXT: Normal ROM in all joints; no deformity noted, no edema, no calf tenderness or calf swelling SKIN: Normal color for age and race; warm; no rash on exposed skin NEURO: Moves all extremities equally, normal speech PSYCH: The patient's mood and manner are appropriate.   ED Results / Procedures / Treatments   LABS: (all labs ordered are listed, but only abnormal results are displayed) Labs Reviewed  RESP PANEL BY RT-PCR (RSV, FLU A&B, COVID)  RVPGX2 - Abnormal; Notable for the following components:      Result Value   Influenza A by PCR POSITIVE (*)    All other components within normal limits  GROUP A STREP BY PCR  LIPASE,  BLOOD  COMPREHENSIVE METABOLIC PANEL  CBC  TROPONIN I (HIGH SENSITIVITY)     EKG:  EKG Interpretation Date/Time:  Wednesday October 12 2023 02:06:53 EDT Ventricular Rate:  92 PR Interval:  138 QRS Duration:  90 QT Interval:  342 QTC Calculation: 422 R Axis:   56  Text Interpretation: Normal sinus rhythm Normal ECG When compared with ECG of 07-Sep-2007 15:53, No significant change was found Confirmed by Rochele Raring [phone]) on 10/12/2023 2:11:55 AM         RADIOLOGY: My personal review and interpretation of imaging: Chest x-ray clear.  I have personally reviewed all radiology reports.   DG Chest 2 View Result Date: 10/11/2023 CLINICAL DATA:  cough EXAM: CHEST - 2 VIEW COMPARISON:  None Available. FINDINGS: The heart and mediastinal contours are within normal limits. No focal consolidation. No pulmonary edema. No pleural effusion. No pneumothorax. No acute osseous abnormality. IMPRESSION: No active cardiopulmonary disease. Electronically Signed   By: Tish Frederickson M.D.   On: 10/11/2023 21:43     PROCEDURES:  Critical Care performed: No     Procedures    IMPRESSION / MDM / ASSESSMENT AND PLAN / ED COURSE  I reviewed the triage vital signs and the nursing notes.    Patient here with flulike symptoms, syncope.     DIFFERENTIAL DIAGNOSIS (includes but not limited to):   Flu, other viral URI, pneumonia, dehydration, anemia, electrolyte derangement, arrhythmia, ACS, PE seem less likely.   Patient's presentation is most consistent with acute presentation with potential threat to life or bodily function.   PLAN: Workup initiated from triage.  Normal hemoglobin, electrolytes, renal function, LFTs and lipase.  Will add on troponin.  He is flu positive.  Strep negative.  Chest x-ray reviewed and interpreted by myself and the radiologist and shows no infiltrate, edema, widened mediastinum, cardiomegaly.  Will obtain EKG.  Will give IV fluids, Toradol, Zofran for  symptomatic relief.   MEDICATIONS GIVEN IN ED: Medications  acetaminophen (TYLENOL) tablet 650 mg (650 mg Oral Given 10/11/23 1911)  ketorolac (TORADOL) 30 MG/ML injection 30 mg (30 mg Intravenous Given 10/12/23 0003)  sodium chloride 0.9 % bolus 1,000 mL (0 mLs Intravenous Stopped 10/12/23 0056)  ondansetron (ZOFRAN) injection 4 mg (4 mg Intravenous Given 10/12/23 0002)  lidocaine (XYLOCAINE) 2 % viscous mouth solution 15 mL (15 mLs Mouth/Throat Given 10/12/23 0003)     ED COURSE: Patient reports feeling better.  Tolerating p.o.  Troponin negative.  EKG shows no arrhythmia, interval abnormality, ischemia, delta wave.  I feel he is safe for discharge.  Discussed risk and benefits of antiviral such as Tamiflu, Xofluza.  Patient declined these medications at this time.  Discussed supportive care instructions and return precautions.  Will discharge with viscous lidocaine  to help with his sore throat and Zofran for nausea and vomiting.   At this time, I do not feel there is any life-threatening condition present. I reviewed all nursing notes, vitals, pertinent previous records.  All lab and urine results, EKGs, imaging ordered have been independently reviewed and interpreted by myself.  I reviewed all available radiology reports from any imaging ordered this visit.  Based on my assessment, I feel the patient is safe to be discharged home without further emergent workup and can continue workup as an outpatient as needed. Discussed all findings, treatment plan as well as usual and customary return precautions.  They verbalize understanding and are comfortable with this plan.  Outpatient follow-up has been provided as needed.  All questions have been answered.    CONSULTS:  none   OUTSIDE RECORDS REVIEWED: Reviewed prior admission in March 2024 at [facility].       FINAL CLINICAL IMPRESSION(S) / ED DIAGNOSES   Final diagnoses:  Influenza A  Syncope, unspecified syncope type     Rx / DC Orders    ED Discharge Orders          Ordered    Ambulatory Referral to Primary Care (Establish Care)        10/12/23 0210    ondansetron (ZOFRAN-ODT) 4 MG disintegrating tablet  Every 6 hours PRN        10/12/23 0211    lidocaine (XYLOCAINE) 2 % solution  Every 4 hours PRN        10/12/23 0211             Note:  This document was prepared using Dragon voice recognition software and may include unintentional dictation errors.   Joyann Spidle, Layla Maw, DO 10/12/23 [phone]
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
## 2023-10-12 LAB — TROPONIN I (HIGH SENSITIVITY): Troponin I (High Sensitivity): 3 ng/L (ref <18)

## 2023-10-12 MED ORDER — LIDOCAINE VISCOUS HCL 2 % MT SOLN: 15.0000 mL | OROMUCOSAL | 0 refills | Status: AC | PRN

## 2023-10-12 MED ORDER — ONDANSETRON 4 MG PO TBDP: 4.0000 mg | ORAL_TABLET | Freq: Four times a day (QID) | ORAL | 0 refills | Status: AC | PRN

## 2023-10-12 NOTE — Discharge Instructions (Addendum)
 You may alternate Tylenol 1000 mg every 6 hours as needed for fever and pain and ibuprofen 800 mg every 6-8 hours as needed for fever and pain. Please rest and drink plenty of fluids. This is a viral illness causing your symptoms. You do not need antibiotics for a virus. You may use over-the-counter nasal saline spray and Afrin nasal saline spray as needed for nasal congestion. Please do not use Afrin for more than 3 days in a row. You may use guaifenesin and dextromethorphan as needed for cough.  You may use lozenges and Chloraseptic spray to help with sore throat.  Warm salt water gargles can also help with sore throat.  You may use over-the-counter Unisom (doxyalamine) or Benadryl (diphenhydramine) to help with sleep.  Please note that some combination medicines such as DayQuil and NyQuil have multiple medications in them.  Please make sure you look at all labels to ensure that you are not taking too much of any one particular medication.  Symptoms from a virus may take 7-14 days to run its course.  You may also use honey and salt water gargles to help with sore throat, cough, postnasal drainage.  The flu is treated like any other virus with supportive measures as listed above. At this time you are outside the treatment window for Tamiflu or Xofluza. These medications have to be taken within the first 48 hours of symptoms.  Tamiflu has many side effects including nausea, vomiting and diarrhea.

## 2023-10-12 NOTE — ED Notes (Signed)
 Pt given a cup of water for po challenge at this time. Pt tolerating water well. Pt c/o sore throat. Pt does not feel nauseous.
